# Patient Record
Sex: Female | Born: 1950 | Race: White | Hispanic: No | Marital: Married | Smoking: Never smoker
Health system: Southern US, Community
[De-identification: ages and names within clinical notes are randomized; demographics above are authoritative.]

## PROBLEM LIST (undated history)

## (undated) DIAGNOSIS — I499 Cardiac arrhythmia, unspecified: Secondary | ICD-10-CM

## (undated) HISTORY — PX: ANTERIOR CRUCIATE LIGAMENT REPAIR: SHX115

## (undated) HISTORY — PX: TONSILLECTOMY: SUR1361

## (undated) HISTORY — PX: OTHER SURGICAL HISTORY: SHX169

---

## 2001-10-15 ENCOUNTER — Encounter: Payer: Self-pay | Admitting: *Deleted

## 2001-10-15 ENCOUNTER — Ambulatory Visit (HOSPITAL_COMMUNITY): Admission: RE | Admit: 2001-10-15 | Discharge: 2001-10-15 | Payer: Self-pay | Admitting: *Deleted

## 2003-04-06 ENCOUNTER — Encounter: Payer: Self-pay | Admitting: *Deleted

## 2003-04-06 ENCOUNTER — Ambulatory Visit (HOSPITAL_COMMUNITY): Admission: RE | Admit: 2003-04-06 | Discharge: 2003-04-06 | Payer: Self-pay | Admitting: *Deleted

## 2003-04-19 ENCOUNTER — Encounter (HOSPITAL_COMMUNITY): Admission: RE | Admit: 2003-04-19 | Discharge: 2003-05-19 | Payer: Self-pay | Admitting: Orthopedic Surgery

## 2003-05-03 ENCOUNTER — Ambulatory Visit (HOSPITAL_COMMUNITY): Admission: RE | Admit: 2003-05-03 | Discharge: 2003-05-03 | Payer: Self-pay | Admitting: *Deleted

## 2003-05-22 ENCOUNTER — Encounter (HOSPITAL_COMMUNITY): Admission: RE | Admit: 2003-05-22 | Discharge: 2003-06-21 | Payer: Self-pay | Admitting: Orthopedic Surgery

## 2004-11-22 ENCOUNTER — Ambulatory Visit (HOSPITAL_COMMUNITY): Admission: RE | Admit: 2004-11-22 | Discharge: 2004-11-22 | Payer: Self-pay | Admitting: Family Medicine

## 2005-03-25 ENCOUNTER — Ambulatory Visit (HOSPITAL_COMMUNITY): Admission: RE | Admit: 2005-03-25 | Discharge: 2005-03-25 | Payer: Self-pay | Admitting: General Surgery

## 2005-12-22 ENCOUNTER — Ambulatory Visit (HOSPITAL_COMMUNITY): Admission: RE | Admit: 2005-12-22 | Discharge: 2005-12-22 | Payer: Self-pay | Admitting: Family Medicine

## 2006-02-09 ENCOUNTER — Ambulatory Visit (HOSPITAL_COMMUNITY): Admission: RE | Admit: 2006-02-09 | Discharge: 2006-02-09 | Payer: Self-pay | Admitting: Urology

## 2006-02-09 IMAGING — CT CT PELVIS WO/W CM
2 of 4 series · 14 of 32 positions shown, 19 images · IV contrast (CONTRAST)
Comparison: none

HISTORY: Gross hematuria, left low back pain

[Series 5335: — · axial · 0.70mm/px · z∈[+1318,+1668]mm · 8 of 91 slices shown, 13 images (1 of 2)]
[im 11/91  soft-tissue]
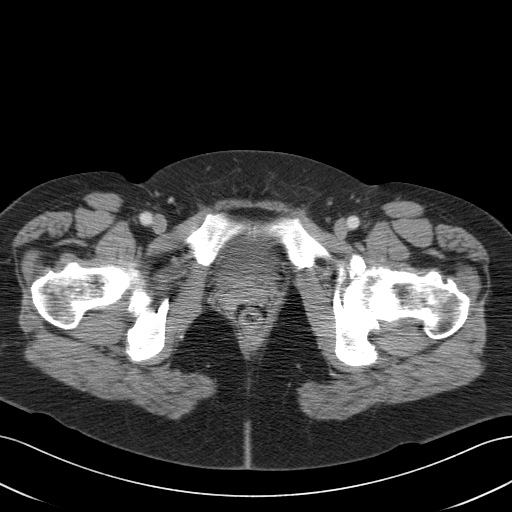
[im 11/91  bone]
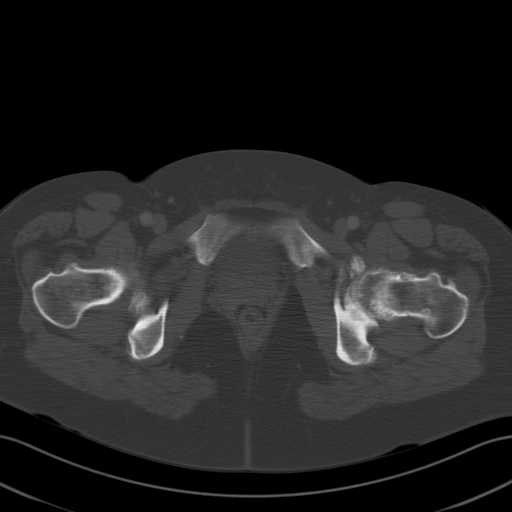
[im 21/91  soft-tissue]
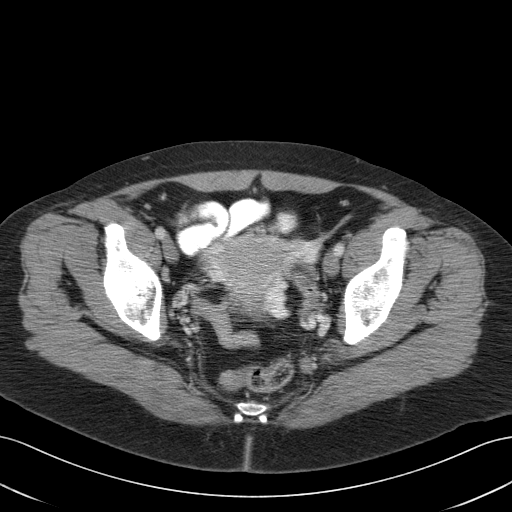
[im 31/91  soft-tissue]
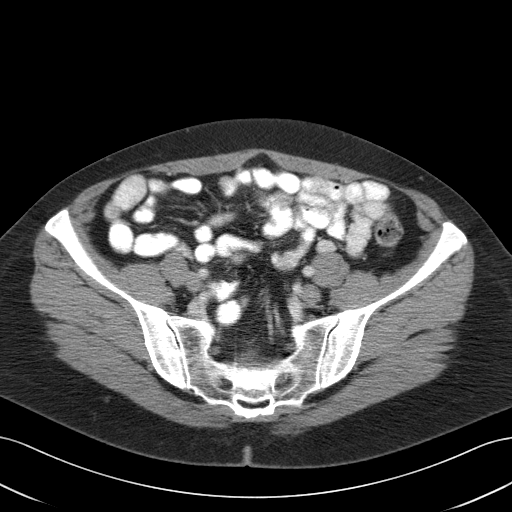
[im 41/91  soft-tissue]
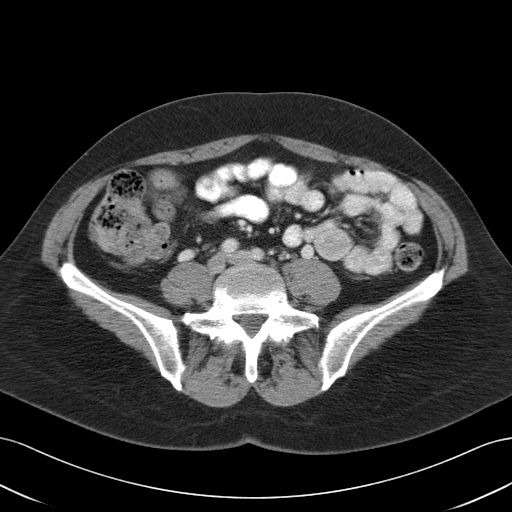
[im 51/91  soft-tissue]
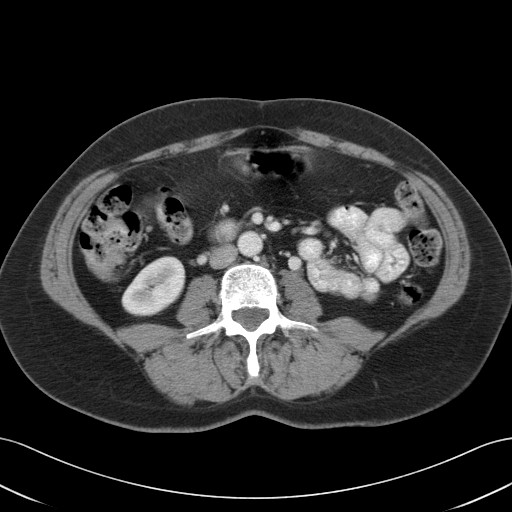
[im 51/91  lung]
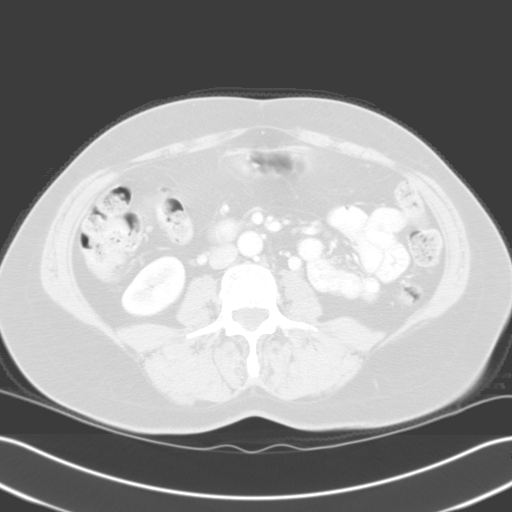
[im 61/91  soft-tissue]
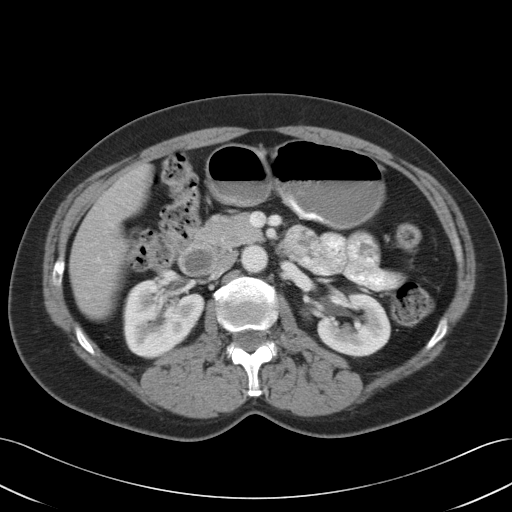
[im 61/91  lung]
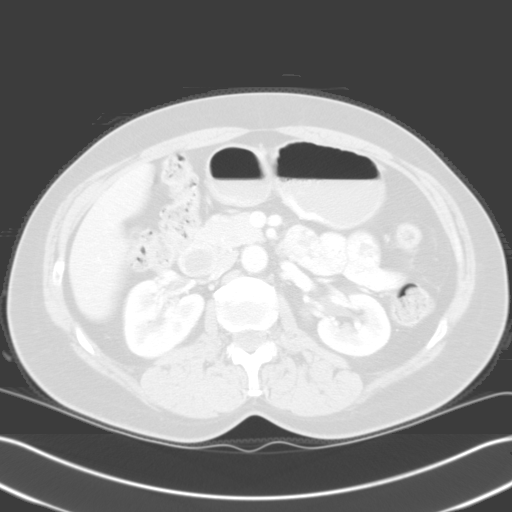
[im 71/91  soft-tissue]
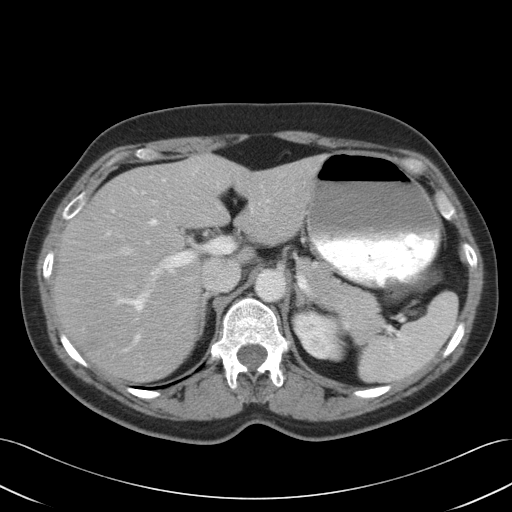
[im 71/91  lung]
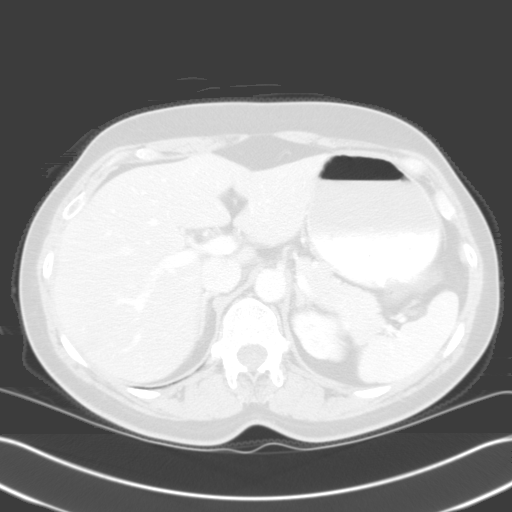
[im 81/91  soft-tissue]
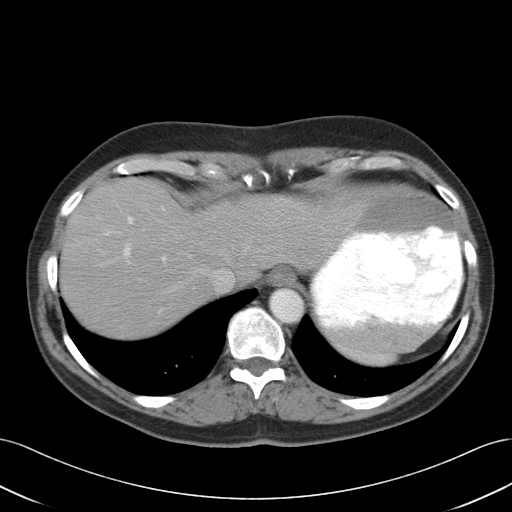
[im 81/91  lung]
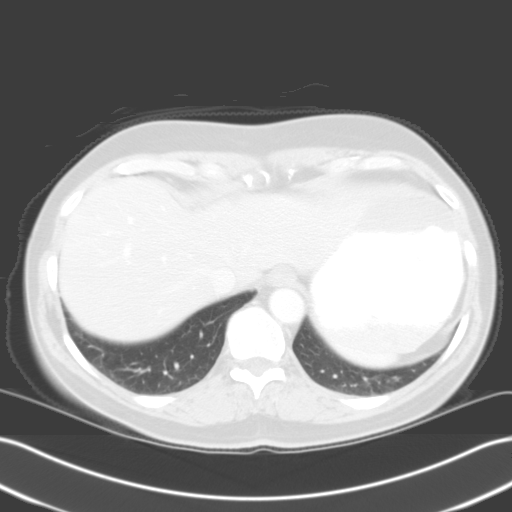

[Series 5336: — · axial · 0.72mm/px · z∈[+1316,+1566]mm · 6 of 90 slices shown (2 of 2)]
[im 10/90  soft-tissue]
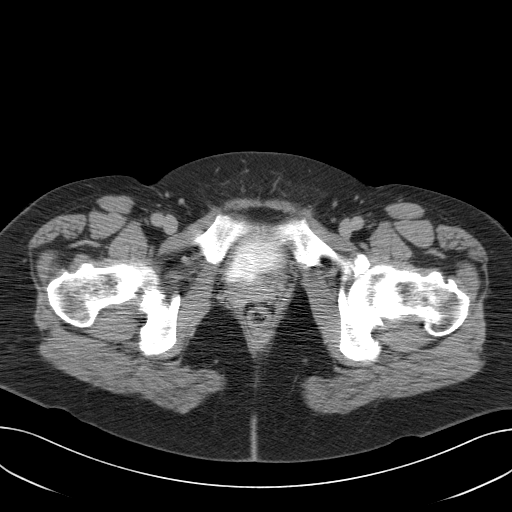
[im 20/90  soft-tissue]
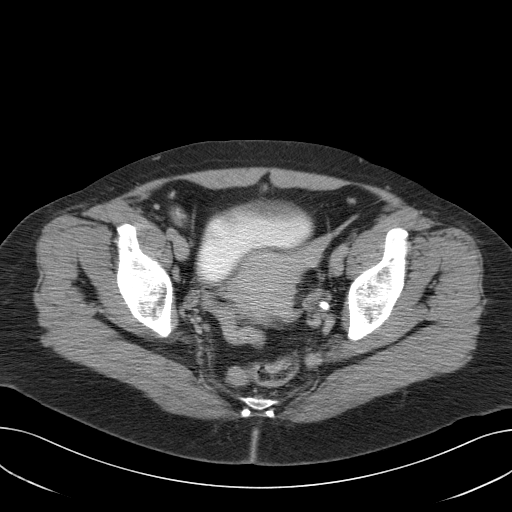
[im 30/90  soft-tissue]
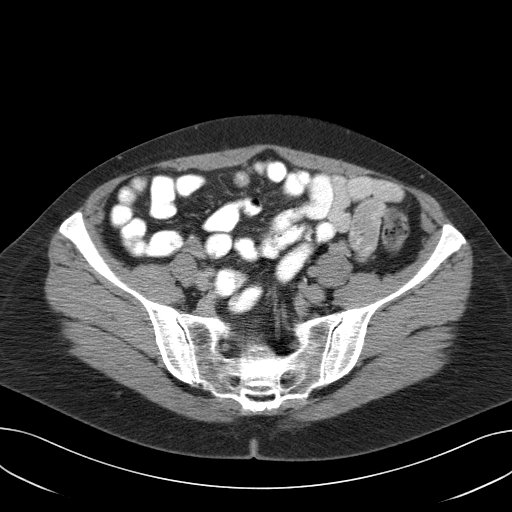
[im 40/90  soft-tissue]
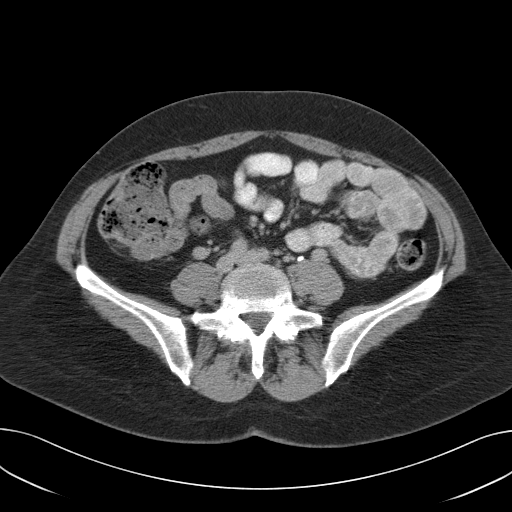
[im 50/90  soft-tissue]
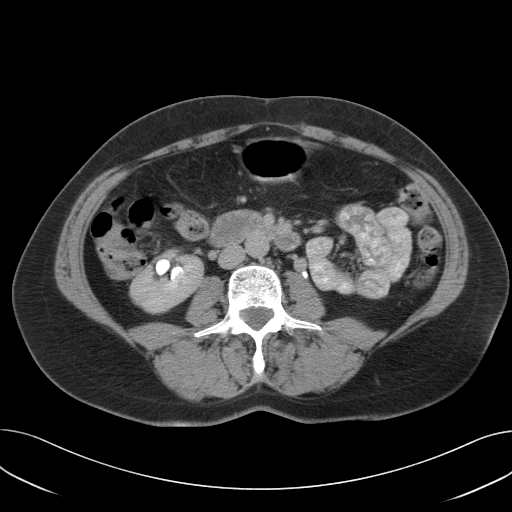
[im 60/90  soft-tissue]
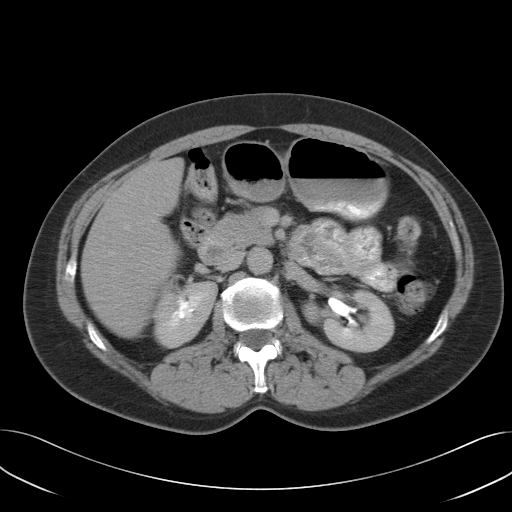

[14 of 32 positions shown; findings below may reference images not displayed]

CT ABDOMEN AND PELVIS WITH AND WITHOUT CONTRAST:

Multidetector helical CT imaging abdomen and pelvis without priors for
comparison.
Exam utilized dilute oral contrast and 100 cc [Q3].
Exam consists of precontrast and postcontrast images.

CT ABDOMEN:

Bilateral renal calculi on noncontrast series, largest at lower pole right
kidney, 7 x 2 mm in size.
Slight malrotation of left kidney, renal hilum directed anteriorly.
Mild prominent right extrarenal pelvis.
No ureteral calcification or dilatation.
Minimal dependent atelectasis at lung bases.
Mild pectus excavatum.

Hepatic cysts, largest right lobe, 1.9 x 1.7 cm image 23.
Normal renal cortical enhancement without mass.
No filling defects within renal collecting systems on the delayed images.
Remainder of liver, spleen, pancreas, and adrenal glands normal.
No upper abdominal mass, adenopathy, free fluid, or inflammatory process. 
Upper abdominal bowel loops normal.
IMPRESSION: Bilateral renal calculi without hydronephrosis or renal mass.
Hepatic cysts.
No acute upper abdominal findings.

CT PELVIS:

Mild constipation.
Prominent pelvic vessels, question pelvic congestion.
No pelvic mass, adenopathy, free fluid, or inflammatory process.
Appendix not identified.
No distal ureteral calcification or dilatation. 
Bladder unremarkable.
IMPRESSION: No acute pelvic abnormalities.
Question pelvic congestion

## 2006-03-03 ENCOUNTER — Ambulatory Visit (HOSPITAL_COMMUNITY): Admission: RE | Admit: 2006-03-03 | Discharge: 2006-03-03 | Payer: Self-pay | Admitting: Urology

## 2006-03-25 ENCOUNTER — Ambulatory Visit (HOSPITAL_COMMUNITY): Admission: RE | Admit: 2006-03-25 | Discharge: 2006-03-25 | Payer: Self-pay | Admitting: Urology

## 2006-03-27 ENCOUNTER — Ambulatory Visit (HOSPITAL_COMMUNITY): Admission: RE | Admit: 2006-03-27 | Discharge: 2006-03-27 | Payer: Self-pay | Admitting: Urology

## 2006-04-28 ENCOUNTER — Ambulatory Visit (HOSPITAL_COMMUNITY): Admission: RE | Admit: 2006-04-28 | Discharge: 2006-04-28 | Payer: Self-pay | Admitting: Urology

## 2006-11-03 ENCOUNTER — Ambulatory Visit (HOSPITAL_COMMUNITY): Admission: RE | Admit: 2006-11-03 | Discharge: 2006-11-03 | Payer: Self-pay | Admitting: Urology

## 2007-09-03 ENCOUNTER — Ambulatory Visit (HOSPITAL_COMMUNITY): Admission: RE | Admit: 2007-09-03 | Discharge: 2007-09-03 | Payer: Self-pay | Admitting: Family Medicine

## 2008-12-06 ENCOUNTER — Ambulatory Visit (HOSPITAL_COMMUNITY): Admission: RE | Admit: 2008-12-06 | Discharge: 2008-12-06 | Payer: Self-pay | Admitting: Family Medicine

## 2010-01-03 ENCOUNTER — Ambulatory Visit (HOSPITAL_COMMUNITY): Admission: RE | Admit: 2010-01-03 | Discharge: 2010-01-03 | Payer: Self-pay | Admitting: Family Medicine

## 2010-01-18 ENCOUNTER — Ambulatory Visit (HOSPITAL_COMMUNITY): Admission: RE | Admit: 2010-01-18 | Discharge: 2010-01-18 | Payer: Self-pay | Admitting: Family Medicine

## 2011-01-10 NOTE — H&P (Signed)
Joy Valenzuela, Joy Valenzuela             ACCOUNT NO.:  [id number]   MEDICAL RECORD NO.:  [id number]          PATIENT TYPE:  AMB   LOCATION:  DAY                           FACILITY:  [facility]   PHYSICIAN:  Dennie Maizes, M.D.   DATE OF BIRTH:  06-08-1951   DATE OF ADMISSION:  DATE OF DISCHARGE:  LH                                HISTORY & PHYSICAL   CHIEF COMPLAINT:  Intermittent hematuria, low back pain, right renal  calculous.   HISTORY OF PRESENT ILLNESS:  This 60 year old female is referred to me by  Dr. Nobie Putnam.  She has had two episodes of intermittent gross hematuria in  March 2007 and June 2007.  She was treated with antibiotics.  Repeat  urinalysis revealed persistent microhematuria.  The patient has undergone  complete evaluation.   The patient complains of intermittent hematuria x2 in March 2007 and June  2007.  She has intermittent low back pain.  She did not have any voiding  difficulty or dysuria.  She has urinary frequency x5 to 6 and nocturia x0 to  1.  There is no history of fever or chills.  There is no past history of  urolithiasis or urinary tract infections.   PAST MEDICAL HISTORY:  1.  Status post tonsillectomy and adenoidectomy in 1971.  2.  Status post knee surgery x2 in 1996 and 2004.   MEDICATIONS:  Calcium with vitamin D, Claritin, vitamin E.   ALLERGIES:  None.   FAMILY HISTORY:  Positive for atrial fibrillation, cerebrovascular accident,  heart disease, prostate cancer, osteoporosis, breast cancer and colon  cancer.   PHYSICAL EXAMINATION:  VITAL SIGNS:  Height 5 foot 6.  Weight 135 pounds.  HEENT:  Normal.  NECK:  No masses.  LUNGS:  Clear to auscultation.  HEART:  Regular rate and rhythm.  No murmurs.  ABDOMEN:  Soft.  No palpable frank mass or CVA tenderness.  Bladder not  palpable.  PELVIC:  Negative.   The patient has undergone complete evaluation for hematuria.  Renal function  is normal.  BUN 17, creatinine 0.89.  Urine cytology revealed no  evidence of  malignant cells.  CT scan of the abdomen and pelvis revealed bilateral small  renal calculi.  There is stone in the lower pole of the right kidney  measuring about 7 mm in size.  There is no evidence of any solid renal mass  or hydronephrosis.  No filling defects are noted.  The patient also had  hepatic cysts.  Cystoscopy in the office revealed a normal bladder.   IMPRESSION:  1.  Hematuria.  2.  Large renal calculous.  3.  Bilateral small renal calculi.   PLAN:  I have discussed with the patient regarding management of the 7 mm  sized right renal calculous.  I recommended lithotripsy and the patient is  agreeable.  The patient is still to undergo extracorporeal shock wave  lithotripsy of the right renal calculous with IV sedation at [facility].  We informed the patient regarding the diagnosis and operative  details, the alternate treatments, outcome, possible risks and complications  and  she has agreed for the procedure to be done.      Dennie Maizes, M.D.  Electronically Signed     SK/MEDQ  D:  03/25/2006  T:  03/25/2006  Job:  [id number]   cc:   Patrica Duel, M.D.  Fax: [phone]

## 2011-01-10 NOTE — H&P (Signed)
Joy Valenzuela, Joy Valenzuela             ACCOUNT NO.:  [id number]   MEDICAL RECORD NO.:  [id number]          PATIENT TYPE:  AMB   LOCATION:                                FACILITY:  [facility]   PHYSICIAN:  Dalia Heading, M.D.  DATE OF BIRTH:  Aug 28, 1950   DATE OF ADMISSION:  03/25/2005  DATE OF DISCHARGE:  LH                                HISTORY & PHYSICAL   CHIEF COMPLAINT:  Family history of colon carcinoma.   HISTORY OF PRESENT ILLNESS:  The patient is a 60 year old white female who  is referred for endoscopic evaluation.  She needs a colonoscopy for a family  history of colon carcinoma.  She had a colonoscopy 5 years ago, which was  negative.  She has a positive family history of colon carcinoma.  She denies  any GI complaints.   PAST MEDICAL HISTORY:  1.  Appendectomy.  2.  Tonsillectomy.  3.  Right ACL repair.   CURRENT MEDICATIONS:  1.  Vitamin E.  2.  Calcium.  3.  Claritin.   ALLERGIES:  No known drug allergies.   REVIEW OF SYSTEMS:  Noncontributory.   PHYSICAL EXAMINATION:  GENERAL:  The patient is a well-developed, well-  nourished white female in no acute distress.  VITAL SIGNS:  She is afebrile, and vital signs are stable.  LUNGS:  Clear to auscultation with equal breath sounds bilaterally.  HEART:  A regular rate and rhythm without S3, S4, or murmurs.  ABDOMEN:  Soft, nontender, nondistended.  No hepatosplenomegaly or masses  are noted.  RECTAL:  Deferred to the procedure.   IMPRESSION:  Family history of colon carcinoma.   PLAN:  The patient is scheduled for a colonoscopy on March 25, 2005.  The  risks and benefits of the procedure including bleeding and perforation were  fully explained to the patient, who gave informed consent.       MAJ/MEDQ  D:  03/06/2005  T:  03/06/2005  Job:  [id number]   cc:   Patrica Duel, M.D.  [address], [address]  [place]  [place] [address]  Fax: [phone]

## 2011-08-08 ENCOUNTER — Other Ambulatory Visit (HOSPITAL_COMMUNITY): Payer: Self-pay | Admitting: Family Medicine

## 2011-08-08 DIAGNOSIS — Z139 Encounter for screening, unspecified: Secondary | ICD-10-CM

## 2011-08-11 ENCOUNTER — Ambulatory Visit (HOSPITAL_COMMUNITY)
Admission: RE | Admit: 2011-08-11 | Discharge: 2011-08-11 | Disposition: A | Discharge status: Home / Self Care | Payer: BC Managed Care – PPO | Source: Ambulatory Visit | Attending: Family Medicine | Admitting: Family Medicine

## 2011-08-11 DIAGNOSIS — Z139 Encounter for screening, unspecified: Secondary | ICD-10-CM

## 2011-08-11 DIAGNOSIS — Z1231 Encounter for screening mammogram for malignant neoplasm of breast: Secondary | ICD-10-CM | POA: Insufficient documentation

## 2011-08-21 ENCOUNTER — Other Ambulatory Visit (HOSPITAL_COMMUNITY): Payer: Self-pay | Admitting: Family Medicine

## 2011-08-21 DIAGNOSIS — Z01419 Encounter for gynecological examination (general) (routine) without abnormal findings: Secondary | ICD-10-CM

## 2011-08-21 DIAGNOSIS — Z139 Encounter for screening, unspecified: Secondary | ICD-10-CM

## 2011-08-28 ENCOUNTER — Ambulatory Visit (HOSPITAL_COMMUNITY)
Admission: RE | Admit: 2011-08-28 | Discharge: 2011-08-28 | Disposition: A | Discharge status: Home / Self Care | Payer: BC Managed Care – PPO | Source: Ambulatory Visit | Attending: Family Medicine | Admitting: Family Medicine

## 2011-08-28 DIAGNOSIS — Z13820 Encounter for screening for osteoporosis: Secondary | ICD-10-CM | POA: Insufficient documentation

## 2011-08-28 DIAGNOSIS — Z01419 Encounter for gynecological examination (general) (routine) without abnormal findings: Secondary | ICD-10-CM

## 2011-08-28 DIAGNOSIS — Z139 Encounter for screening, unspecified: Secondary | ICD-10-CM

## 2013-02-22 ENCOUNTER — Other Ambulatory Visit (HOSPITAL_COMMUNITY): Payer: Self-pay | Admitting: Family Medicine

## 2013-02-22 DIAGNOSIS — Z139 Encounter for screening, unspecified: Secondary | ICD-10-CM

## 2013-03-01 ENCOUNTER — Ambulatory Visit (HOSPITAL_COMMUNITY)
Admission: RE | Admit: 2013-03-01 | Discharge: 2013-03-01 | Disposition: A | Discharge status: Home / Self Care | Payer: BC Managed Care – PPO | Source: Ambulatory Visit | Attending: Family Medicine | Admitting: Family Medicine

## 2013-03-01 DIAGNOSIS — Z139 Encounter for screening, unspecified: Secondary | ICD-10-CM

## 2013-03-01 DIAGNOSIS — Z1231 Encounter for screening mammogram for malignant neoplasm of breast: Secondary | ICD-10-CM | POA: Insufficient documentation

## 2015-01-18 ENCOUNTER — Other Ambulatory Visit (HOSPITAL_COMMUNITY): Payer: Self-pay | Admitting: Family Medicine

## 2015-01-18 DIAGNOSIS — Z1231 Encounter for screening mammogram for malignant neoplasm of breast: Secondary | ICD-10-CM

## 2015-01-24 ENCOUNTER — Ambulatory Visit (HOSPITAL_COMMUNITY)
Admission: RE | Admit: 2015-01-24 | Discharge: 2015-01-24 | Disposition: A | Discharge status: Home / Self Care | Payer: BLUE CROSS/BLUE SHIELD | Source: Ambulatory Visit | Attending: Family Medicine | Admitting: Family Medicine

## 2015-01-24 DIAGNOSIS — Z1231 Encounter for screening mammogram for malignant neoplasm of breast: Secondary | ICD-10-CM | POA: Diagnosis not present

## 2016-02-19 ENCOUNTER — Other Ambulatory Visit (HOSPITAL_COMMUNITY): Payer: Self-pay | Admitting: Family Medicine

## 2016-02-19 DIAGNOSIS — Z1231 Encounter for screening mammogram for malignant neoplasm of breast: Secondary | ICD-10-CM

## 2016-02-22 ENCOUNTER — Ambulatory Visit (HOSPITAL_COMMUNITY)
Admission: RE | Admit: 2016-02-22 | Discharge: 2016-02-22 | Disposition: A | Discharge status: Home / Self Care | Payer: BLUE CROSS/BLUE SHIELD | Source: Ambulatory Visit | Attending: Family Medicine | Admitting: Family Medicine

## 2016-02-22 DIAGNOSIS — Z1231 Encounter for screening mammogram for malignant neoplasm of breast: Secondary | ICD-10-CM | POA: Diagnosis not present

## 2016-08-30 DIAGNOSIS — Z23 Encounter for immunization: Secondary | ICD-10-CM | POA: Diagnosis not present

## 2016-10-21 DIAGNOSIS — L680 Hirsutism: Secondary | ICD-10-CM | POA: Diagnosis not present

## 2016-10-21 DIAGNOSIS — L570 Actinic keratosis: Secondary | ICD-10-CM | POA: Diagnosis not present

## 2016-10-21 DIAGNOSIS — D225 Melanocytic nevi of trunk: Secondary | ICD-10-CM | POA: Diagnosis not present

## 2016-10-21 DIAGNOSIS — X32XXXD Exposure to sunlight, subsequent encounter: Secondary | ICD-10-CM | POA: Diagnosis not present

## 2016-10-21 DIAGNOSIS — Z1283 Encounter for screening for malignant neoplasm of skin: Secondary | ICD-10-CM | POA: Diagnosis not present

## 2016-11-13 DIAGNOSIS — R69 Illness, unspecified: Secondary | ICD-10-CM | POA: Diagnosis not present

## 2016-12-25 ENCOUNTER — Other Ambulatory Visit (HOSPITAL_COMMUNITY): Payer: Self-pay | Admitting: Family Medicine

## 2016-12-25 DIAGNOSIS — Z1231 Encounter for screening mammogram for malignant neoplasm of breast: Secondary | ICD-10-CM

## 2017-01-08 DIAGNOSIS — Z0001 Encounter for general adult medical examination with abnormal findings: Secondary | ICD-10-CM | POA: Diagnosis not present

## 2017-01-08 DIAGNOSIS — I472 Ventricular tachycardia: Secondary | ICD-10-CM | POA: Diagnosis not present

## 2017-01-08 DIAGNOSIS — E782 Mixed hyperlipidemia: Secondary | ICD-10-CM | POA: Diagnosis not present

## 2017-01-08 DIAGNOSIS — Z1389 Encounter for screening for other disorder: Secondary | ICD-10-CM | POA: Diagnosis not present

## 2017-01-08 DIAGNOSIS — Z6829 Body mass index (BMI) 29.0-29.9, adult: Secondary | ICD-10-CM | POA: Diagnosis not present

## 2017-01-13 ENCOUNTER — Other Ambulatory Visit (HOSPITAL_COMMUNITY): Payer: Self-pay | Admitting: Family Medicine

## 2017-01-13 DIAGNOSIS — E2839 Other primary ovarian failure: Secondary | ICD-10-CM

## 2017-01-21 ENCOUNTER — Ambulatory Visit (HOSPITAL_COMMUNITY)
Admission: RE | Admit: 2017-01-21 | Discharge: 2017-01-21 | Disposition: A | Discharge status: Home / Self Care | Payer: Medicare HMO | Source: Ambulatory Visit | Attending: Family Medicine | Admitting: Family Medicine

## 2017-01-21 DIAGNOSIS — E2839 Other primary ovarian failure: Secondary | ICD-10-CM | POA: Insufficient documentation

## 2017-01-21 DIAGNOSIS — Z780 Asymptomatic menopausal state: Secondary | ICD-10-CM | POA: Diagnosis not present

## 2017-01-29 ENCOUNTER — Encounter: Payer: Self-pay | Admitting: General Surgery

## 2017-01-29 ENCOUNTER — Ambulatory Visit (INDEPENDENT_AMBULATORY_CARE_PROVIDER_SITE_OTHER): Payer: Medicare HMO | Admitting: General Surgery

## 2017-01-29 VITALS — BP 127/75 | HR 57 | Temp 97.8°F | Resp 18 | Ht 66.0 in | Wt 183.0 lb

## 2017-01-29 DIAGNOSIS — Z1211 Encounter for screening for malignant neoplasm of colon: Secondary | ICD-10-CM

## 2017-01-29 DIAGNOSIS — Z1231 Encounter for screening mammogram for malignant neoplasm of breast: Secondary | ICD-10-CM | POA: Diagnosis not present

## 2017-01-29 MED ORDER — PEG 3350-KCL-NABCB-NACL-NASULF 236 G PO SOLR: 4000.0000 mL | Freq: Once | ORAL | 0 refills | Status: AC

## 2017-01-29 NOTE — Progress Notes (Signed)
Joy Valenzuela; [id number]; 1951/07/23   HPI Patient is a 66 year old white female who was referred to my care by Dr. Gerarda Fraction for a screening colonoscopy. She last had a colonoscopy 15 years ago. She denies any blood per rectum, family history colon cancer, abdominal pain, diarrhea, constipation. She is having no pain. No past medical history on file. Hypertension, high cholesterol level No past surgical history on file.  No family history on file.  No current outpatient prescriptions on file prior to visit.   No current facility-administered medications on file prior to visit.     Not on File  History  Alcohol use Not on file    History  Smoking Status  . Never Smoker  Smokeless Tobacco  . Never Used    Review of Systems  Constitutional: Negative.   HENT: Negative.   Eyes: Negative.   Respiratory: Negative.   Cardiovascular: Negative.   Gastrointestinal: Negative.   Genitourinary: Negative.   Musculoskeletal: Negative.   Skin: Negative.   Neurological: Negative.   Endo/Heme/Allergies: Negative.   Psychiatric/Behavioral: Negative.     Objective   Vitals:   01/29/17 1010  BP: 127/75  Pulse: (!) 57  Resp: 18  Temp: 97.8 F (36.6 C)    Physical Exam  Constitutional: She is oriented to person, place, and time and well-developed, well-nourished, and in no distress.  HENT:  Head: Normocephalic and atraumatic.  Cardiovascular: Normal rate, regular rhythm and normal heart sounds.   No murmur heard. Pulmonary/Chest: Effort normal and breath sounds normal. She has no wheezes. She has no rales.  Abdominal: Soft. Bowel sounds are normal. She exhibits no distension. There is no tenderness. There is no rebound.  Neurological: She is alert and oriented to person, place, and time.  Skin: Skin is warm and dry.  Vitals reviewed.   Assessment  Need for screening colonoscopy Plan   Patient scheduled for a colonoscopy on 02/17/2017. The risks and benefits of the  procedure including bleeding and perforation were fully explained to the patient, who gave informed consent. GoLYTELY prep has been prescribed.

## 2017-01-29 NOTE — H&P (Signed)
Joy Valenzuela; [id number]; 27-Jul-1951   HPI Patient is a 66 year old white female who was referred to my care by Dr. Gerarda Fraction for a screening colonoscopy. She last had a colonoscopy 15 years ago. She denies any blood per rectum, family history colon cancer, abdominal pain, diarrhea, constipation. She is having no pain. No past medical history on file. Hypertension, high cholesterol level No past surgical history on file.  No family history on file.  No current outpatient prescriptions on file prior to visit.   No current facility-administered medications on file prior to visit.     Not on File     History  Alcohol use Not on file       History  Smoking Status  . Never Smoker  Smokeless Tobacco  . Never Used    Review of Systems  Constitutional: Negative.   HENT: Negative.   Eyes: Negative.   Respiratory: Negative.   Cardiovascular: Negative.   Gastrointestinal: Negative.   Genitourinary: Negative.   Musculoskeletal: Negative.   Skin: Negative.   Neurological: Negative.   Endo/Heme/Allergies: Negative.   Psychiatric/Behavioral: Negative.     Objective      Vitals:   01/29/17 1010  BP: 127/75  Pulse: (!) 57  Resp: 18  Temp: 97.8 F (36.6 C)    Physical Exam  Constitutional: She is oriented to person, place, and time and well-developed, well-nourished, and in no distress.  HENT:  Head: Normocephalic and atraumatic.  Cardiovascular: Normal rate, regular rhythm and normal heart sounds.   No murmur heard. Pulmonary/Chest: Effort normal and breath sounds normal. She has no wheezes. She has no rales.  Abdominal: Soft. Bowel sounds are normal. She exhibits no distension. There is no tenderness. There is no rebound.  Neurological: She is alert and oriented to person, place, and time.  Skin: Skin is warm and dry.  Vitals reviewed.   Assessment  Need for screening colonoscopy Plan   Patient scheduled for a colonoscopy on 02/17/2017. The  risks and benefits of the procedure including bleeding and perforation were fully explained to the patient, who gave informed consent. GoLYTELY prep has been prescribed.

## 2017-01-29 NOTE — Patient Instructions (Signed)
Polyethylene Glycol; Electrolytes oral solution What is this medicine? POLYETHYLENE GLYCOL; ELECTROLYTES (pol ee ETH i leen GLYE col; i LEK truh lahyts) is a laxative. It is used to clean out the bowel before a colonoscopy. This medicine may be used for other purposes; ask your health care provider or pharmacist if you have questions. COMMON BRAND NAME(S): Colyte, GaviLyte-C, GaviLyte-G, GaviLyte-N, GoLYTELY, NuLYTELY, OCL, Suclear, TriLyte What should I tell my health care provider before I take this medicine? They need to know if you have any of these conditions: -any chronic disease of the intestine, stomach, or throat -bloating or pain in stomach area -dehydration -difficulty swallowing -heart disease -kidney disease -low levels of sodium in the blood -seizures -an unusual or allergic reaction to polyethylene glycol, other medicines, dyes, or preservatives -pregnant or trying to get pregnant -breast-feeding How should I use this medicine? Take this medicine by mouth. Before using this medicine you or your pharmacist must fill the container with the amount of water indicated on the package label. Chill solution before using to improve taste. Shake well before each dose. Your doctor will tell you what time to start this medicine. Take exactly as directed. You will usually have the first bowel movement about 1 hour after you begin drinking the solution. After that, you will have frequent watery bowel movements. You will need to follow a special diet before your procedure. Talk to your doctor. Do not eat any solid foods for 3 to 4 hours before taking this medicine. A special MedGuide will be given to you by the pharmacist with each prescription and refill. Be sure to read this information carefully each time. Talk to your pediatrician regarding the use of this medicine in children. Special care may be needed. Overdosage: If you think you have taken too much of this medicine contact a poison  control center or emergency room at once. NOTE: This medicine is only for you. Do not share this medicine with others. What if I miss a dose? You should talk to your doctor if you are not able to complete the bowel preparation as advised. What may interact with this medicine? Do not take any other medicine by mouth starting one hour before you take this medicine. Talk to your doctor about when to take your other medicines. This medicine may interact with the following medications: -certain medicines for blood pressure, heart disease, irregular heart beat -certain medicines for kidney disease -certain medicines for seizures like carbamazepine, phenobarbital, phenytoin -diuretics -laxatives -NSAIDS, medicines for pain and inflammation, like ibuprofen or naproxen This list may not describe all possible interactions. Give your health care provider a list of all the medicines, herbs, non-prescription drugs, or dietary supplements you use. Also tell them if you smoke, drink alcohol, or use illegal drugs. Some items may interact with your medicine. What should I watch for while using this medicine? Tell your doctor if you experience any changes in bowel habits that are severe or last longer than three weeks. Do not inhale dust from the solution powder. This can make breathing difficult or may cause sneezing or an allergic-type reaction. Bloating may occur before the first bowel movement. If your discomfort continues while you are drinking the solution, stop drinking the solution temporarily or drink each portion with longer spaces in between until your symptoms disappear. Contact your doctor or health care professional if you have any concerns. What side effects may I notice from receiving this medicine? Side effects that you should report to your doctor  or health care professional as soon as possible: -allergic reactions like skin rash, itching or hives, swelling of the face, lips, or  tongue -breathing problems -chest pain -fast, irregular heartbeat -seizures -trouble passing urine or change in the amount of urine -vomiting Side effects that usually do not require medical attention (report to your doctor or health care professional if they continue or are bothersome): -anal irritation -bloating, pain, or distension of the stomach -headache -hunger, thirst -nausea -stomach gas This list may not describe all possible side effects. Call your doctor for medical advice about side effects. You may report side effects to FDA at 1-800-FDA-1088. Where should I keep my medicine? Keep out of the reach of children. Store the solution in the refrigerator to improve the taste. Do not freeze. Throw away any unused medicine 48 hours after mixing. NOTE: This sheet is a summary. It may not cover all possible information. If you have questions about this medicine, talk to your doctor, pharmacist, or health care provider.  2018 Elsevier/Gold Standard (2015-09-13 13:13:26) Colonoscopy, Adult A colonoscopy is an exam to look at the entire large intestine. During the exam, a lubricated, bendable tube is inserted into the anus and then passed into the rectum, colon, and other parts of the large intestine. A colonoscopy is often done as a part of normal colorectal screening or in response to certain symptoms, such as anemia, persistent diarrhea, abdominal pain, and blood in the stool. The exam can help screen for and diagnose medical problems, including:  Tumors.  Polyps.  Inflammation.  Areas of bleeding.  Tell a health care provider about:  Any allergies you have.  All medicines you are taking, including vitamins, herbs, eye drops, creams, and over-the-counter medicines.  Any problems you or family members have had with anesthetic medicines.  Any blood disorders you have.  Any surgeries you have had.  Any medical conditions you have.  Any problems you have had passing  stool. What are the risks? Generally, this is a safe procedure. However, problems may occur, including:  Bleeding.  A tear in the intestine.  A reaction to medicines given during the exam.  Infection (rare).  What happens before the procedure? Eating and drinking restrictions Follow instructions from your health care provider about eating and drinking, which may include:  A few days before the procedure - follow a low-fiber diet. Avoid nuts, seeds, dried fruit, raw fruits, and vegetables.  1-3 days before the procedure - follow a clear liquid diet. Drink only clear liquids, such as clear broth or bouillon, black coffee or tea, clear juice, clear soft drinks or sports drinks, gelatin dessert, and popsicles. Avoid any liquids that contain red or purple dye.  On the day of the procedure - do not eat or drink anything during the 2 hours before the procedure, or within the time period that your health care provider recommends.  Bowel prep If you were prescribed an oral bowel prep to clean out your colon:  Take it as told by your health care provider. Starting the day before your procedure, you will need to drink a large amount of medicated liquid. The liquid will cause you to have multiple loose stools until your stool is almost clear or light green.  If your skin or anus gets irritated from diarrhea, you may use these to relieve the irritation: ? Medicated wipes, such as adult wet wipes with aloe and vitamin E. ? A skin soothing-product like petroleum jelly.  If you vomit while drinking  the bowel prep, take a break for up to 60 minutes and then begin the bowel prep again. If vomiting continues and you cannot take the bowel prep without vomiting, call your health care provider.  General instructions  Ask your health care provider about changing or stopping your regular medicines. This is especially important if you are taking diabetes medicines or blood thinners.  Plan to have someone  take you home from the hospital or clinic. What happens during the procedure?  An IV tube may be inserted into one of your veins.  You will be given medicine to help you relax (sedative).  To reduce your risk of infection: ? Your health care team will wash or sanitize their hands. ? Your anal area will be washed with soap.  You will be asked to lie on your side with your knees bent.  Your health care provider will lubricate a long, thin, flexible tube. The tube will have a camera and a light on the end.  The tube will be inserted into your anus.  The tube will be gently eased through your rectum and colon.  Air will be delivered into your colon to keep it open. You may feel some pressure or cramping.  The camera will be used to take images during the procedure.  A small tissue sample may be removed from your body to be examined under a microscope (biopsy). If any potential problems are found, the tissue will be sent to a lab for testing.  If small polyps are found, your health care provider may remove them and have them checked for cancer cells.  The tube that was inserted into your anus will be slowly removed. The procedure may vary among health care providers and hospitals. What happens after the procedure?  Your blood pressure, heart rate, breathing rate, and blood oxygen level will be monitored until the medicines you were given have worn off.  Do not drive for 24 hours after the exam.  You may have a small amount of blood in your stool.  You may pass gas and have mild abdominal cramping or bloating due to the air that was used to inflate your colon during the exam.  It is up to you to get the results of your procedure. Ask your health care provider, or the department performing the procedure, when your results will be ready. This information is not intended to replace advice given to you by your health care provider. Make sure you discuss any questions you have with your  health care provider. Document Released: 08/08/2000 Document Revised: 06/11/2016 Document Reviewed: 10/23/2015 Elsevier Interactive Patient Education  2018 [employer].

## 2017-02-17 ENCOUNTER — Encounter (HOSPITAL_COMMUNITY): Payer: Self-pay

## 2017-02-17 ENCOUNTER — Encounter (HOSPITAL_COMMUNITY)
Admission: RE | Disposition: A | Discharge status: Home / Self Care | Payer: Self-pay | Source: Ambulatory Visit | Attending: General Surgery

## 2017-02-17 ENCOUNTER — Ambulatory Visit (HOSPITAL_COMMUNITY)
Admission: RE | Admit: 2017-02-17 | Discharge: 2017-02-17 | Disposition: A | Discharge status: Home / Self Care | Payer: Medicare HMO | Source: Ambulatory Visit | Attending: General Surgery | Admitting: General Surgery

## 2017-02-17 DIAGNOSIS — I10 Essential (primary) hypertension: Secondary | ICD-10-CM | POA: Insufficient documentation

## 2017-02-17 DIAGNOSIS — K644 Residual hemorrhoidal skin tags: Secondary | ICD-10-CM | POA: Insufficient documentation

## 2017-02-17 DIAGNOSIS — Z1211 Encounter for screening for malignant neoplasm of colon: Secondary | ICD-10-CM

## 2017-02-17 HISTORY — DX: Cardiac arrhythmia, unspecified: I49.9

## 2017-02-17 HISTORY — PX: COLONOSCOPY: SHX5424

## 2017-02-17 SURGERY — COLONOSCOPY
Anesthesia: Moderate Sedation

## 2017-02-17 MED ORDER — MEPERIDINE HCL 50 MG/ML IJ SOLN
INTRAMUSCULAR | Status: DC | PRN
  Administered 2017-02-17: 50 mg via INTRAVENOUS

## 2017-02-17 MED ORDER — MIDAZOLAM HCL 5 MG/5ML IJ SOLN
INTRAMUSCULAR | Status: AC
  Filled 2017-02-17: qty 10

## 2017-02-17 MED ORDER — MEPERIDINE HCL 50 MG/ML IJ SOLN
INTRAMUSCULAR | Status: AC
  Filled 2017-02-17: qty 1

## 2017-02-17 MED ORDER — ATROPINE SULFATE 1 MG/ML IJ SOLN
INTRAMUSCULAR | Status: DC | PRN
  Administered 2017-02-17: .5 mg via INTRAVENOUS

## 2017-02-17 MED ORDER — MIDAZOLAM HCL 5 MG/5ML IJ SOLN
INTRAMUSCULAR | Status: DC | PRN
  Administered 2017-02-17: 3 mg via INTRAVENOUS
  Administered 2017-02-17: 1 mg via INTRAVENOUS

## 2017-02-17 MED ORDER — ATROPINE SULFATE 1 MG/ML IJ SOLN
INTRAMUSCULAR | Status: AC
  Filled 2017-02-17: qty 1

## 2017-02-17 MED ORDER — SODIUM CHLORIDE 0.9 % IV SOLN
INTRAVENOUS | Status: DC
  Administered 2017-02-17: 1000 mL via INTRAVENOUS

## 2017-02-17 NOTE — Interval H&P Note (Signed)
History and Physical Interval Note:  02/17/2017 7:25 AM  Joy Valenzuela  has presented today for surgery, with the diagnosis of screening  The various methods of treatment have been discussed with the patient and family. After consideration of risks, benefits and other options for treatment, the patient has consented to  Procedure(s): COLONOSCOPY (N/A) as a surgical intervention .  The patient's history has been reviewed, patient examined, no change in status, stable for surgery.  I have reviewed the patient's chart and labs.  Questions were answered to the patient's satisfaction.     Aviva Signs

## 2017-02-17 NOTE — Discharge Instructions (Signed)
Colonoscopy, Adult, Care After This sheet gives you information about how to care for yourself after your procedure. Your health care provider may also give you more specific instructions. If you have problems or questions, contact your health care provider. What can I expect after the procedure? After the procedure, it is common to have:  A small amount of blood in your stool for 24 hours after the procedure.  Some gas.  Mild abdominal cramping or bloating.  Follow these instructions at home: General instructions   For the first 24 hours after the procedure: ? Do not drive or use machinery. ? Do not sign important documents. ? Do not drink alcohol. ? Do your regular daily activities at a slower pace than normal. ? Eat soft, easy-to-digest foods. ? Rest often.  Take over-the-counter or prescription medicines only as told by your health care provider.  It is up to you to get the results of your procedure. Ask your health care provider, or the department performing the procedure, when your results will be ready. Relieving cramping and bloating  Try walking around when you have cramps or feel bloated.  Apply heat to your abdomen as told by your health care provider. Use a heat source that your health care provider recommends, such as a moist heat pack or a heating pad. ? Place a towel between your skin and the heat source. ? Leave the heat on for 20-30 minutes. ? Remove the heat if your skin turns bright red. This is especially important if you are unable to feel pain, heat, or cold. You may have a greater risk of getting burned. Eating and drinking  Drink enough fluid to keep your urine clear or pale yellow.  Resume your normal diet as instructed by your health care provider. Avoid heavy or fried foods that are hard to digest.  Avoid drinking alcohol for as long as instructed by your health care provider. Contact a health care provider if:  You have blood in your stool 2-3  days after the procedure. Get help right away if:  You have more than a small spotting of blood in your stool.  You pass large blood clots in your stool.  Your abdomen is swollen.  You have nausea or vomiting.  You have a fever.  You have increasing abdominal pain that is not relieved with medicine. This information is not intended to replace advice given to you by your health care provider. Make sure you discuss any questions you have with your health care provider. Document Released: 03/25/2004 Document Revised: 05/05/2016 Document Reviewed: 10/23/2015 Elsevier Interactive Patient Education  2018 [employer].   Colonoscopy, Adult, Care After This sheet gives you information about how to care for yourself after your procedure. Your health care provider may also give you more specific instructions. If you have problems or questions, contact your health care provider. What can I expect after the procedure? After the procedure, it is common to have:  A small amount of blood in your stool for 24 hours after the procedure.  Some gas.  Mild abdominal cramping or bloating.  Follow these instructions at home: General instructions   For the first 24 hours after the procedure: ? Do not drive or use machinery. ? Do not sign important documents. ? Do not drink alcohol. ? Do your regular daily activities at a slower pace than normal. ? Eat soft, easy-to-digest foods. ? Rest often.  Take over-the-counter or prescription medicines only as told by your health care  provider.  It is up to you to get the results of your procedure. Ask your health care provider, or the department performing the procedure, when your results will be ready. Relieving cramping and bloating  Try walking around when you have cramps or feel bloated.  Apply heat to your abdomen as told by your health care provider. Use a heat source that your health care provider recommends, such as a moist heat pack or a  heating pad. ? Place a towel between your skin and the heat source. ? Leave the heat on for 20-30 minutes. ? Remove the heat if your skin turns bright red. This is especially important if you are unable to feel pain, heat, or cold. You may have a greater risk of getting burned. Eating and drinking  Drink enough fluid to keep your urine clear or pale yellow.  Resume your normal diet as instructed by your health care provider. Avoid heavy or fried foods that are hard to digest.  Avoid drinking alcohol for as long as instructed by your health care provider. Contact a health care provider if:  You have blood in your stool 2-3 days after the procedure. Get help right away if:  You have more than a small spotting of blood in your stool.  You pass large blood clots in your stool.  Your abdomen is swollen.  You have nausea or vomiting.  You have a fever.  You have increasing abdominal pain that is not relieved with medicine. This information is not intended to replace advice given to you by your health care provider. Make sure you discuss any questions you have with your health care provider. Document Released: 03/25/2004 Document Revised: 05/05/2016 Document Reviewed: 10/23/2015 Elsevier Interactive Patient Education  [employer].

## 2017-02-17 NOTE — Op Note (Signed)
[facility] Patient Name: Joy Valenzuela Procedure Date: 02/17/2017 7:02 AM MRN: [id number] Date of Birth: 1951/04/07 Attending MD: Aviva Signs , MD CSN: [id number] Age: 66 Admit Type: Outpatient Procedure:                Colonoscopy Indications:              Screening for colorectal malignant neoplasm Providers:                Aviva Signs, MD, Charlyne Petrin RN, RN, Aram Candela Referring MD:              Medicines:                Midazolam 4 mg IV, Meperidine 50 mg IV, Atropine                            0.5 mg IV Complications:            No immediate complications. Estimated blood loss:                            None. Estimated Blood Loss:     Estimated blood loss: none. Procedure:                Pre-Anesthesia Assessment:                           - Prior to the procedure, a History and Physical                            was performed, and patient medications and                            allergies were reviewed. The patient is competent.                            The risks and benefits of the procedure and the                            sedation options and risks were discussed with the                            patient. All questions were answered and informed                            consent was obtained. Patient identification and                            proposed procedure were verified by the physician,                            the nurse and the technician in the endoscopy  suite. Mental Status Examination: alert and                            oriented. Airway Examination: normal oropharyngeal                            airway and neck mobility. Respiratory Examination:                            clear to auscultation. CV Examination: RRR, no                            murmurs, no S3 or S4. Prophylactic Antibiotics: The                            patient does not require prophylactic antibiotics.                           Prior Anticoagulants: The patient has taken no                            previous anticoagulant or antiplatelet agents. ASA                            Grade Assessment: II - A patient with mild systemic                            disease. After reviewing the risks and benefits,                            the patient was deemed in satisfactory condition to                            undergo the procedure. The anesthesia plan was to                            use moderate sedation / analgesia (conscious                            sedation). Immediately prior to administration of                            medications, the patient was re-assessed for                            adequacy to receive sedatives. The heart rate,                            respiratory rate, oxygen saturations, blood                            pressure, adequacy of pulmonary ventilation, and  response to care were monitored throughout the                            procedure. The physical status of the patient was                            re-assessed after the procedure.                           After obtaining informed consent, the colonoscope                            was passed under direct vision. Throughout the                            procedure, the patient's blood pressure, pulse, and                            oxygen saturations were monitored continuously. The                            EC-[id number] ([id number]) scope was introduced through                            the anus and advanced to the the cecum, identified                            by the appendiceal orifice, ileocecal valve and                            palpation. No anatomical landmarks were                            photographed. The entire colon was well visualized.                            The colonoscopy was somewhat difficult due to a                            tortuous colon. The  patient tolerated the procedure                            well. The quality of the bowel preparation was                            adequate. The total duration of the procedure was                            25 minutes. Scope In: 7:28:57 AM Scope Out: 7:52:51 AM Scope Withdrawal Time: 0 hours 3 minutes 51 seconds  Total Procedure Duration: 0 hours 23 minutes 54 seconds  Findings:      The perianal exam findings include non-thrombosed external hemorrhoids.      The entire examined colon appeared normal on direct and  retroflexion       views. Impression:               - Non-thrombosed external hemorrhoids found on                            perianal exam.                           - The entire examined colon is normal on direct and                            retroflexion views.                           - No specimens collected. Moderate Sedation:      Moderate (conscious) sedation was administered by the endoscopy nurse       and supervised by the endoscopist. The following parameters were       monitored: oxygen saturation, heart rate, blood pressure, and response       to care. Total physician intraservice time was 25 minutes. Recommendation:           - Written discharge instructions were provided to                            the patient.                           - The signs and symptoms of potential delayed                            complications were discussed with the patient.                           - Patient has a contact number available for                            emergencies.                           - Return to normal activities tomorrow.                           - Resume previous diet.                           - Continue present medications.                           - Repeat colonoscopy in 10 years for screening                            purposes. Procedure Code(s):        --- Professional ---                           Y6948, Colorectal cancer screening;  colonoscopy on  individual not meeting criteria for high risk                           99153, Moderate sedation services; each additional                            15 minutes intraservice time                           G0500, Moderate sedation services provided by the                            same physician or other qualified health care                            professional performing a gastrointestinal                            endoscopic service that sedation supports,                            requiring the presence of an independent trained                            observer to assist in the monitoring of the                            patient's level of consciousness and physiological                            status; initial 15 minutes of intra-service time;                            patient age 35 years or older (additional time may                            be reported with [phone], as appropriate) Diagnosis Code(s):        --- Professional ---                           Z12.11, Encounter for screening for malignant                            neoplasm of colon                           K64.4, Residual hemorrhoidal skin tags CPT copyright 2016 [employer]. All rights reserved. The codes documented in this report are preliminary and upon coder review may  be revised to meet current compliance requirements. Aviva Signs, MD Aviva Signs, MD 02/17/2017 7:59:42 AM This report has been signed electronically. Number of Addenda: 0

## 2017-02-19 ENCOUNTER — Encounter (HOSPITAL_COMMUNITY): Payer: Self-pay | Admitting: General Surgery
